# Patient Record
Sex: Male | Born: 2000 | Race: Black or African American | Hispanic: No | Marital: Single | Smoking: Never smoker
Health system: Southern US, Community
[De-identification: ages and names within clinical notes are randomized; demographics above are authoritative.]

## PROBLEM LIST (undated history)

## (undated) HISTORY — PX: NO PAST SURGERIES: SHX2092

---
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
## 2020-01-01 ENCOUNTER — Ambulatory Visit: Payer: Medicaid Other | Attending: Internal Medicine
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
## 2020-06-07 ENCOUNTER — Other Ambulatory Visit: Payer: Self-pay

## 2020-06-07 ENCOUNTER — Ambulatory Visit
Admission: EM | Admit: 2020-06-07 | Discharge: 2020-06-07 | Disposition: A | Discharge status: Home / Self Care | Payer: Medicaid Other | Attending: Emergency Medicine | Admitting: Emergency Medicine

## 2020-06-07 DIAGNOSIS — Z20822 Contact with and (suspected) exposure to covid-19: Secondary | ICD-10-CM | POA: Diagnosis not present

## 2020-06-07 DIAGNOSIS — R112 Nausea with vomiting, unspecified: Secondary | ICD-10-CM | POA: Diagnosis not present

## 2020-06-07 DIAGNOSIS — R059 Cough, unspecified: Secondary | ICD-10-CM

## 2020-06-07 DIAGNOSIS — R05 Cough: Secondary | ICD-10-CM | POA: Insufficient documentation

## 2020-06-07 LAB — SARS CORONAVIRUS 2 (TAT 6-24 HRS): SARS Coronavirus 2: NEGATIVE

## 2020-06-07 NOTE — Discharge Instructions (Signed)
You were seen at the Urgent Care for cough. Be sure to stay hydrated. Follow up with your PCP as needed.   If you haven't already, sign up for My Chart to have easy access to your labs results, and communication with your primary care physician.  Dr. Rachael Darby

## 2020-06-07 NOTE — ED Provider Notes (Signed)
[facility]    CSN: [id number] Arrival date & time: 06/07/20  1145      History   Chief Complaint Chief Complaint  Patient presents with  . Cough    HPI Marc Green is a 19 y.o. male.   HPI  Patient reports cough for the past 4-5 days. Endorses vomiting. Patient reports he got work at [employer] and vomited once after he got off work. Works 12 hours shift but doesn't typically eat or drink during his shift. States it gets very hot inside his job. Denies fever ,chills, abdominal pain, myalgias, headaches, shortness of breath or chest pain. Cough worse with smoking new methanol cigarettes. Normally smokes [employer]. Smokes 1/4 ppd. Started smoking 2-3 months ago. No other reported sick contacts.  No other complaints. [place] tested negative for COVID about a week ago at the pharmacy.  Patient is partially COVID vaccinated.    History reviewed. No pertinent past medical history.  There are no problems to display for this patient.   Past Surgical History:  Procedure Laterality Date  . NO PAST SURGERIES         Home Medications    Prior to Admission medications   Medication Sig Start Date End Date Taking? Authorizing Provider  benzonatate (TESSALON) 200 MG capsule Take 1 capsule (200 mg total) by mouth 3 (three) times daily as needed for cough. 08/02/19   Everlene Other G, DO  ipratropium (ATROVENT) 0.06 % nasal spray Place 2 sprays into both nostrils 4 (four) times daily as needed for rhinitis. 06/23/18 05/23/19  Tommie Sams, DO  loratadine (CLARITIN) 10 MG tablet Take 1 tablet (10 mg total) by mouth daily. 05/23/19 06/15/19  Verlee Monte, NP    Family History Family History  Problem Relation Age of Onset  . Hypertension Mother   . Asthma Mother     Social History Social History   Tobacco Use  . Smoking status: Never Smoker  . Smokeless tobacco: Never Used  Vaping Use  . Vaping Use: Former  Substance Use Topics  . Alcohol use: Never  . Drug use:  Never     Allergies   Cephalosporins   Review of Systems Review of Systems :See HPI    Physical Exam Triage Vital Signs ED Triage Vitals  Enc Vitals Group     BP 06/07/20 1223 119/75     Pulse Rate 06/07/20 1223 63     Resp 06/07/20 1223 16     Temp 06/07/20 1223 97.9 F (36.6 C)     Temp Source 06/07/20 1223 Oral     SpO2 06/07/20 1223 100 %     Weight 06/07/20 1225 120 lb (54.4 kg)     Height 06/07/20 1225 5\' 6"  (1.676 m)     Head Circumference --      Peak Flow --      Pain Score 06/07/20 1225 0     Pain Loc --      Pain Edu? --      Excl. in GC? --    No data found.  Updated Vital Signs BP 119/75 (BP Location: Left Arm)   Pulse 63   Temp 97.9 F (36.6 C) (Oral)   Resp 16   Ht 5\' 6"  (1.676 m)   Wt 120 lb (54.4 kg)   SpO2 100%   BMI 19.37 kg/m   Visual Acuity Right Eye Distance:   Left Eye Distance:   Bilateral Distance:    Right Eye Near:  Left Eye Near:    Bilateral Near:     Physical Exam Vitals and nursing note reviewed.  Constitutional:      Appearance: Normal appearance. He is not ill-appearing.  HENT:     Head: Normocephalic and atraumatic.     Nose: Nose normal.     Mouth/Throat:     Mouth: Mucous membranes are moist.     Pharynx: Oropharynx is clear.  Eyes:     General:        Right eye: No discharge.        Left eye: No discharge.     Extraocular Movements: Extraocular movements intact.     Conjunctiva/sclera: Conjunctivae normal.  Cardiovascular:     Rate and Rhythm: Normal rate and regular rhythm.     Pulses: Normal pulses.     Heart sounds: Normal heart sounds. No murmur heard.   Pulmonary:     Effort: Pulmonary effort is normal. No respiratory distress.     Breath sounds: Normal breath sounds.  Abdominal:     General: Abdomen is flat. Bowel sounds are normal.     Palpations: Abdomen is soft.     Tenderness: There is no abdominal tenderness. There is no guarding or rebound.  Musculoskeletal:        General: Normal  range of motion.     Cervical back: Normal range of motion and neck supple. No tenderness.  Skin:    General: Skin is warm and dry.     Capillary Refill: Capillary refill takes less than 2 seconds.  Neurological:     Mental Status: He is alert and oriented to person, place, and time. Mental status is at baseline.  Psychiatric:        Mood and Affect: Mood normal.        Behavior: Behavior normal.     UC Treatments / Results  Labs (all labs ordered are listed, but only abnormal results are displayed) Labs Reviewed  SARS CORONAVIRUS 2 (TAT 6-24 HRS)    EKG   Radiology No results found.  Procedures Procedures (including critical care time)  Medications Ordered in UC Medications - No data to display  Initial Impression / Assessment and Plan / UC Course  I have reviewed the triage vital signs and the nursing notes.  Pertinent labs & imaging results that were available during my care of the patient were reviewed by me and considered in my medical decision making (see chart for details).     Cough  Intermittent non productive cough for past 5 days. Appears to be associated with smoking new cigarettes but in the setting of vomiting can not rule out COVID. Covid test pending. Overall, patient is well appearing, afebrile and well hydrated.  Advised patient to stop smoking cigarettes. Work note provided.    Final Clinical Impressions(s) / UC Diagnoses   Final diagnoses:  Cough  Nausea and vomiting, intractability of vomiting not specified, unspecified vomiting type  Encounter for laboratory testing for COVID-19 virus     Discharge Instructions     You were seen at the Urgent Care for cough. Be sure to stay hydrated. Follow up with your PCP as needed.   If you haven't already, sign up for My Chart to have easy access to your labs results, and communication with your primary care physician.  Dr. Rachael Darby      ED Prescriptions    None     PDMP not reviewed this  encounter.   Katha Cabal, DO 06/07/20  1306  

## 2020-06-07 NOTE — ED Triage Notes (Signed)
Patient in today w/ c/o cough. Sx onset approx. 4-5 days ago.   Moderna 1st dose received 05/31/2020.

## 2021-01-20 ENCOUNTER — Other Ambulatory Visit: Payer: Self-pay

## 2021-01-20 ENCOUNTER — Ambulatory Visit
Admission: EM | Admit: 2021-01-20 | Discharge: 2021-01-20 | Disposition: A | Discharge status: Home / Self Care | Payer: Medicaid Other | Attending: Sports Medicine | Admitting: Sports Medicine

## 2021-01-20 DIAGNOSIS — B349 Viral infection, unspecified: Secondary | ICD-10-CM | POA: Insufficient documentation

## 2021-01-20 DIAGNOSIS — R1012 Left upper quadrant pain: Secondary | ICD-10-CM | POA: Diagnosis present

## 2021-01-20 DIAGNOSIS — J029 Acute pharyngitis, unspecified: Secondary | ICD-10-CM | POA: Diagnosis present

## 2021-01-20 DIAGNOSIS — R0989 Other specified symptoms and signs involving the circulatory and respiratory systems: Secondary | ICD-10-CM | POA: Insufficient documentation

## 2021-01-20 DIAGNOSIS — Z20822 Contact with and (suspected) exposure to covid-19: Secondary | ICD-10-CM | POA: Diagnosis not present

## 2021-01-20 DIAGNOSIS — R059 Cough, unspecified: Secondary | ICD-10-CM | POA: Diagnosis not present

## 2021-01-20 LAB — RESP PANEL BY RT-PCR (FLU A&B, COVID) ARPGX2
Influenza A by PCR: NEGATIVE
Influenza B by PCR: NEGATIVE
SARS Coronavirus 2 by RT PCR: NEGATIVE

## 2021-01-20 LAB — GROUP A STREP BY PCR: Group A Strep by PCR: NOT DETECTED

## 2021-01-20 NOTE — ED Notes (Signed)
Patient states that he does not want to have the mono blood test.  Dr. Zachery Dauer was notified.

## 2021-01-20 NOTE — Discharge Instructions (Addendum)
Please see educational handouts. Your strep, COVID, and influenza tests are all negative. I recommended doing a monotest but she declined. I did give you some educational handouts on mono, but we do not know if you have it,  this is just for your own education. Plenty of rest, plenty of fluids, Tylenol or Motrin for any fever or discomfort. Since we do not know your monotest status, you should not participate in any contact activities for 3 to 4 weeks after your symptoms resolve. If your abdominal pain was to worsen then I want you to go to the emergency room or see your primary care provider.

## 2021-01-20 NOTE — ED Triage Notes (Signed)
Patient states that he has been having a sore throat with severe pain with swallowing. Patient reports that he also has been having chest congestion. States that this started last night for him.

## 2021-01-20 NOTE — ED Provider Notes (Signed)
[facility]    CSN: [id number] Arrival date & time: 01/20/21  1146      History   Chief Complaint Chief Complaint  Patient presents with  . Sore Throat    HPI Marc Green is a 20 y.o. male.   Patient is a pleasant 20 year old male who presents for evaluation of a sore throat, difficulty swallowing, and some chest tightness for 2 days now.  He normally sees [facility] in [place].  They were unavailable to see him today.  He works for [employer] and has a part-time job at [employer].  He reports no cough, ear pain, chest pain, shortness of breath, headache, abdominal symptoms, or urinary symptoms.  He has been vaccinated x2 but no booster.  He has received his flu shot.  No COVID history of COVID exposure.  No mono history or mono exposure.  No red flag signs or symptoms were elicited on history.     History reviewed. No pertinent past medical history.  There are no problems to display for this patient.   Past Surgical History:  Procedure Laterality Date  . NO PAST SURGERIES         Home Medications    Prior to Admission medications   Medication Sig Start Date End Date Taking? Authorizing Provider  benzonatate (TESSALON) 200 MG capsule Take 1 capsule (200 mg total) by mouth 3 (three) times daily as needed for cough. 08/02/19   Everlene Other G, DO  ipratropium (ATROVENT) 0.06 % nasal spray Place 2 sprays into both nostrils 4 (four) times daily as needed for rhinitis. 06/23/18 05/23/19  Tommie Sams, DO  loratadine (CLARITIN) 10 MG tablet Take 1 tablet (10 mg total) by mouth daily. 05/23/19 06/15/19  Verlee Monte, NP    Family History Family History  Problem Relation Age of Onset  . Hypertension Mother   . Asthma Mother     Social History Social History   Tobacco Use  . Smoking status: Never Smoker  . Smokeless tobacco: Never Used  Vaping Use  . Vaping Use: Former  Substance Use Topics  . Alcohol use: Never  . Drug use: Never      Allergies   Cephalosporins   Review of Systems Review of Systems  Constitutional: Negative for activity change, appetite change, chills, diaphoresis, fatigue and fever.  HENT: Positive for sore throat and trouble swallowing. Negative for congestion, ear pain, postnasal drip, rhinorrhea, sinus pressure and sinus pain.   Eyes: Negative.  Negative for pain.  Respiratory: Positive for chest tightness. Negative for cough, shortness of breath, wheezing and stridor.   Cardiovascular: Negative.  Negative for chest pain and palpitations.  Gastrointestinal: Negative.  Negative for abdominal pain, diarrhea, nausea and vomiting.  Genitourinary: Negative.  Negative for dysuria.  Musculoskeletal: Negative.  Negative for arthralgias, back pain, myalgias, neck pain and neck stiffness.  Skin: Negative.  Negative for color change, pallor, rash and wound.  Neurological: Negative for dizziness, weakness, light-headedness, numbness and headaches.  All other systems reviewed and are negative.    Physical Exam Triage Vital Signs ED Triage Vitals [01/20/21 1159]  Enc Vitals Group     BP 124/78     Pulse Rate 85     Resp 18     Temp 99.4 F (37.4 C)     Temp Source Oral     SpO2 100 %     Weight 122 lb (55.3 kg)     Height  Head Circumference      Peak Flow      Pain Score 7     Pain Loc      Pain Edu?      Excl. in GC?    No data found.  Updated Vital Signs BP 124/78 (BP Location: Left Arm)   Pulse 85   Temp 99.4 F (37.4 C) (Oral)   Resp 18   Wt 55.3 kg   SpO2 100%   BMI 19.69 kg/m   Visual Acuity Right Eye Distance:   Left Eye Distance:   Bilateral Distance:    Right Eye Near:   Left Eye Near:    Bilateral Near:     Physical Exam Vitals and nursing note reviewed.  Constitutional:      General: He is not in acute distress.    Appearance: He is well-developed. He is not ill-appearing, toxic-appearing or diaphoretic.  HENT:     Head: Normocephalic and  atraumatic.     Right Ear: Tympanic membrane normal.     Left Ear: Tympanic membrane normal.     Nose: Nose normal. No congestion or rhinorrhea.     Mouth/Throat:     Mouth: Mucous membranes are moist. No oral lesions.     Pharynx: Uvula midline. Posterior oropharyngeal erythema present. No pharyngeal swelling, oropharyngeal exudate or uvula swelling.     Tonsils: No tonsillar exudate or tonsillar abscesses. 2+ on the right. 2+ on the left.  Eyes:     General: No scleral icterus.       Right eye: No discharge.        Left eye: No discharge.     Extraocular Movements: Extraocular movements intact.     Right eye: Normal extraocular motion.     Left eye: Normal extraocular motion.     Conjunctiva/sclera: Conjunctivae normal.     Pupils: Pupils are equal, round, and reactive to light.  Neck:     Thyroid: No thyromegaly.  Cardiovascular:     Rate and Rhythm: Normal rate and regular rhythm.     Pulses: Normal pulses.     Heart sounds: Normal heart sounds. No murmur heard. No friction rub. No gallop.   Pulmonary:     Effort: Pulmonary effort is normal. No respiratory distress.     Breath sounds: Normal breath sounds. No stridor. No wheezing, rhonchi or rales.  Abdominal:     General: Bowel sounds are normal.     Palpations: Abdomen is soft. There is no shifting dullness, fluid wave, hepatomegaly or splenomegaly.     Tenderness: There is abdominal tenderness in the left upper quadrant. There is no right CVA tenderness, left CVA tenderness, guarding or rebound. Negative signs include Murphy's sign, Rovsing's sign, McBurney's sign and psoas sign.  Musculoskeletal:     Cervical back: Normal range of motion and neck supple.  Lymphadenopathy:     Cervical: Cervical adenopathy present.  Skin:    General: Skin is warm and dry.     Capillary Refill: Capillary refill takes less than 2 seconds.     Coloration: Skin is not pale.     Findings: No erythema or rash.  Neurological:     General: No  focal deficit present.     Mental Status: He is alert and oriented to person, place, and time.      UC Treatments / Results  Labs (all labs ordered are listed, but only abnormal results are displayed) Labs Reviewed  RESP PANEL BY RT-PCR (FLU A&B, COVID)  ARPGX2  GROUP A STREP BY PCR    EKG   Radiology No results found.  Procedures Procedures (including critical care time)  Medications Ordered in UC Medications - No data to display  Initial Impression / Assessment and Plan / UC Course  I have reviewed the triage vital signs and the nursing notes.  Pertinent labs & imaging results that were available during my care of the patient were reviewed by me and considered in my medical decision making (see chart for details).  Clinical impression: 20 year old male with 2 days of sore throat, difficulty swallowing, and chest tightness.  No evidence of anaphylaxis.  He does not have any other symptoms.  Treatment plan: 1.  The findings and treatment plan were discussed in detail with the patient.  Patient was in agreement. 2.  We will get a strep test.  It was negative. 3.  Also obtain a respiratory panel including a COVID-19 test, influenza A and influenza B.  They were all negative. 4.  Given his severe sore throat and a negative strep and negative respiratory panel, I recommended getting a mononucleosis test.  The staff went in to draw the blood but he declined with going through with this test. 5.  I did give him some educational handouts on mononucleosis.  He should avoid any strenuous or contact activity for at least 3 weeks after his symptoms resolved.  He voiced verbal understanding. 6.  Plenty of rest, plenty of fluids, Tylenol or Motrin for any fever or discomfort. 7.  I did give him a work note keeping him out of his part-time job over the weekend.  He can return to work on Monday. 8.  If his symptoms were to worsen he knows to see his primary care provider, or come back here,  or if they worsen in any way he should go to the ER.  He does have left upper quadrant pain over the spleen which makes mono a concerning diagnosis.  If his belly pain got worse I advised him to go to the ER. 9.  He was discharged in stable condition and he will follow-up with us as needed.  Greater than 30 minutes was spent with the patient, obtaining a history, reviewing his chart in detail, doing a thorough review of systems and physical exam, doing testing and interpreting the results, counseling the patient on the fact that he declined on mono testing, again the patient on the risks of mono as well as his abdominal pain and when to seek out medical attention, providing educational handouts, and encouraging and answering all questions.    Final Clinical Impressions(s) / UC Diagnoses   Final diagnoses:  Pharyngitis, unspecified etiology  Abdominal pain, left upper quadrant  Chest congestion  Cough  Viral illness     Discharge Instructions     Please see educational handouts. Your strep, COVID, and influenza tests are all negative. I recommended doing a monotest but she declined. I did give you some educational handouts on mono, but we do not know if you have it,  this is just for your own education. Plenty of rest, plenty of fluids, Tylenol or Motrin for any fever or discomfort. Since we do not know your monotest status, you should not participate in any contact activities for 3 to 4 weeks after your symptoms resolve. If your abdominal pain was to worsen then I want you to go to the emergency room or see your primary care provider.    ED Prescriptions  None     PDMP not reviewed this encounter.   Delton See, MD 01/22/21 [phone]

## 2021-01-22 ENCOUNTER — Ambulatory Visit
Admission: EM | Admit: 2021-01-22 | Discharge: 2021-01-22 | Disposition: A | Discharge status: Home / Self Care | Payer: Medicaid Other | Attending: Family Medicine | Admitting: Family Medicine

## 2021-01-22 ENCOUNTER — Other Ambulatory Visit: Payer: Self-pay

## 2021-01-22 DIAGNOSIS — M546 Pain in thoracic spine: Secondary | ICD-10-CM | POA: Diagnosis not present

## 2021-01-22 MED ORDER — MELOXICAM 15 MG PO TABS: 15.0000 mg | ORAL_TABLET | Freq: Every day | ORAL | 0 refills | Status: DC | PRN

## 2021-01-22 NOTE — ED Provider Notes (Signed)
[facility]    CSN: [id number] Arrival date & time: 01/22/21  1440      History   Chief Complaint Chief Complaint  Patient presents with  . Back Pain   HPI  20 year old male presents with thoracic back pain.  Patient states that his pain started a few hours ago.  He states it is located in the mid back.  No fall, trauma, injury.  No medications or interventions tried.  When asked why he has not attempted anything he states that he did not know what to take.  Pain 5/10 in severity.  No other complaints.  Home Medications    Prior to Admission medications   Medication Sig Start Date End Date Taking? Authorizing Provider  meloxicam (MOBIC) 15 MG tablet Take 1 tablet (15 mg total) by mouth daily as needed for pain. 01/22/21  Yes Clebert Wenger G, DO  ipratropium (ATROVENT) 0.06 % nasal spray Place 2 sprays into both nostrils 4 (four) times daily as needed for rhinitis. 06/23/18 05/23/19  Tommie Sams, DO  loratadine (CLARITIN) 10 MG tablet Take 1 tablet (10 mg total) by mouth daily. 05/23/19 06/15/19  Verlee Monte, NP    Family History Family History  Problem Relation Age of Onset  . Hypertension Mother   . Asthma Mother     Social History Social History   Tobacco Use  . Smoking status: Never Smoker  . Smokeless tobacco: Never Used  Vaping Use  . Vaping Use: Former  Substance Use Topics  . Alcohol use: Never  . Drug use: Never     Allergies   Cephalosporins   Review of Systems Review of Systems  Constitutional: Negative.   Musculoskeletal: Positive for back pain.   Physical Exam Triage Vital Signs ED Triage Vitals  Enc Vitals Group     BP 01/22/21 1519 106/60     Pulse Rate 01/22/21 1519 73     Resp 01/22/21 1519 14     Temp 01/22/21 1519 98.6 F (37 C)     Temp Source 01/22/21 1519 Oral     SpO2 01/22/21 1519 100 %     Weight 01/22/21 1518 121 lb 14.6 oz (55.3 kg)     Height 01/22/21 1518 5\' 5"  (1.651 m)     Head Circumference --      Peak  Flow --      Pain Score 01/22/21 1518 5     Pain Loc --      Pain Edu? --      Excl. in GC? --    Updated Vital Signs BP 106/60 (BP Location: Left Arm)   Pulse 73   Temp 98.6 F (37 C) (Oral)   Resp 14   Ht 5\' 5"  (1.651 m)   Wt 55.3 kg   SpO2 100%   BMI 20.29 kg/m   Visual Acuity Right Eye Distance:   Left Eye Distance:   Bilateral Distance:    Right Eye Near:   Left Eye Near:    Bilateral Near:     Physical Exam Vitals and nursing note reviewed.  Constitutional:      General: He is not in acute distress.    Appearance: Normal appearance. He is not ill-appearing.  HENT:     Head: Normocephalic and atraumatic.  Eyes:     General:        Right eye: No discharge.        Left eye: No discharge.     Conjunctiva/sclera: Conjunctivae  normal.  Cardiovascular:     Rate and Rhythm: Normal rate and regular rhythm.  Pulmonary:     Effort: Pulmonary effort is normal.     Breath sounds: Normal breath sounds.  Musculoskeletal:     Comments: No tenderness of the thoracic spine on exam  Neurological:     Mental Status: He is alert.  Psychiatric:        Mood and Affect: Mood normal.        Behavior: Behavior normal.    UC Treatments / Results  Labs (all labs ordered are listed, but only abnormal results are displayed) Labs Reviewed - No data to display  EKG   Radiology No results found.  Procedures Procedures (including critical care time)  Medications Ordered in UC Medications - No data to display  Initial Impression / Assessment and Plan / UC Course  I have reviewed the triage vital signs and the nursing notes.  Pertinent labs & imaging results that were available during my care of the patient were reviewed by me and considered in my medical decision making (see chart for details).    20 year old male presents with acute thoracic back pain.  Meloxicam as directed.  Supportive care.  Final Clinical Impressions(s) / UC Diagnoses   Final diagnoses:  Acute  thoracic back pain, unspecified back pain laterality     Discharge Instructions     Apply ice and heat.  Medication as directed.  Take care  Dr. Adriana Simas    ED Prescriptions    Medication Sig Dispense Auth. Provider   meloxicam (MOBIC) 15 MG tablet Take 1 tablet (15 mg total) by mouth daily as needed for pain. 30 tablet Tommie Sams, DO     PDMP not reviewed this encounter.   Tommie Sams, [place] 01/22/21 1531

## 2021-01-22 NOTE — ED Triage Notes (Signed)
Patient states that he is having back pain that started a few hours ago without injury. Mid back pain.

## 2021-01-22 NOTE — Discharge Instructions (Signed)
Apply ice and heat.  Medication as directed.  Take care  Dr. Adriana Simas

## 2021-05-24 ENCOUNTER — Encounter: Payer: Self-pay | Admitting: Emergency Medicine

## 2021-05-24 ENCOUNTER — Ambulatory Visit
Admission: EM | Admit: 2021-05-24 | Discharge: 2021-05-24 | Disposition: A | Discharge status: Home / Self Care | Payer: Medicaid Other | Attending: Family Medicine | Admitting: Family Medicine

## 2021-05-24 ENCOUNTER — Other Ambulatory Visit: Payer: Self-pay

## 2021-05-24 DIAGNOSIS — R0981 Nasal congestion: Secondary | ICD-10-CM

## 2021-05-24 DIAGNOSIS — R059 Cough, unspecified: Secondary | ICD-10-CM | POA: Diagnosis not present

## 2021-05-24 MED ORDER — BENZONATATE 200 MG PO CAPS: 200.0000 mg | ORAL_CAPSULE | Freq: Three times a day (TID) | ORAL | 0 refills | Status: DC | PRN

## 2021-05-24 MED ORDER — FEXOFENADINE HCL 60 MG PO TABS: 60.0000 mg | ORAL_TABLET | Freq: Two times a day (BID) | ORAL | 1 refills | Status: DC

## 2021-05-24 NOTE — ED Triage Notes (Signed)
Pt presents today with c/o of dry barky cough x 4 days. Denies fever.  He declines to have Covid test performed today. He is requesting two work notes for work (two jobs).

## 2021-05-24 NOTE — Discharge Instructions (Addendum)
Medication as prescribed.  Take care  Dr. Janelys Glassner  

## 2021-05-24 NOTE — ED Provider Notes (Signed)
[facility]    CSN: [id number] Arrival date & time: 05/24/21  1818      History   Chief Complaint Chief Complaint  Patient presents with   Cough    HPI  20 year old male presents with cough.   4 days history of cough. Also has some nasal congestion which he attributes to allergies. Declines COVID testing. No report sick contacts. No fever. No SOB. No relieving factors. No other complaints.   Home Medications    Prior to Admission medications   Medication Sig Start Date End Date Taking? Authorizing Provider  benzonatate (TESSALON) 200 MG capsule Take 1 capsule (200 mg total) by mouth 3 (three) times daily as needed for cough. 05/24/21  Yes Ludene Stokke G, DO  fexofenadine (ALLEGRA) 60 MG tablet Take 1 tablet (60 mg total) by mouth 2 (two) times daily. 05/24/21  Yes Winnifred Dufford G, DO  ipratropium (ATROVENT) 0.06 % nasal spray Place 2 sprays into both nostrils 4 (four) times daily as needed for rhinitis. 06/23/18 05/23/19  Tommie Sams, DO  loratadine (CLARITIN) 10 MG tablet Take 1 tablet (10 mg total) by mouth daily. 05/23/19 06/15/19  Verlee Monte, NP    Family History Family History  Problem Relation Age of Onset   Hypertension Mother    Asthma Mother     Social History Social History   Tobacco Use   Smoking status: Never   Smokeless tobacco: Never  Vaping Use   Vaping Use: Never used  Substance Use Topics   Alcohol use: Never   Drug use: Yes    Types: Marijuana    Comment: 2xday     Allergies   Cephalosporins   Review of Systems Review of Systems Per HPI  Physical Exam Triage Vital Signs ED Triage Vitals  Enc Vitals Group     BP 05/24/21 1909 135/79     Pulse Rate 05/24/21 1909 87     Resp 05/24/21 1909 18     Temp 05/24/21 1909 98.2 F (36.8 C)     Temp Source 05/24/21 1909 Oral     SpO2 05/24/21 1909 100 %     Weight --      Height --      Head Circumference --      Peak Flow --      Pain Score 05/24/21 1910 0     Pain Loc --       Pain Edu? --      Excl. in GC? --    Updated Vital Signs BP 135/79 (BP Location: Left Arm)   Pulse 87   Temp 98.2 F (36.8 C) (Oral)   Resp 18   SpO2 100%   Visual Acuity Right Eye Distance:   Left Eye Distance:   Bilateral Distance:    Right Eye Near:   Left Eye Near:    Bilateral Near:     Physical Exam Constitutional:      General: He is not in acute distress.    Appearance: Normal appearance. He is not ill-appearing.  HENT:     Head: Normocephalic and atraumatic.  Cardiovascular:     Rate and Rhythm: Normal rate and regular rhythm.  Pulmonary:     Effort: Pulmonary effort is normal.     Breath sounds: Normal breath sounds. No wheezing or rales.  Neurological:     Mental Status: He is alert.   UC Treatments / Results  Labs (all labs ordered are listed, but only abnormal  results are displayed) Labs Reviewed - No data to display  EKG   Radiology No results found.  Procedures Procedures (including critical care time)  Medications Ordered in UC Medications - No data to display  Initial Impression / Assessment and Plan / UC Course  I have reviewed the triage vital signs and the nursing notes.  Pertinent labs & imaging results that were available during my care of the patient were reviewed by me and considered in my medical decision making (see chart for details).    20 year old male presents with cough and congestion. Exam unremarkable. Likely viral with a component of underlying allergies. Allegra and Tessalon perles as prescribed.   Final Clinical Impressions(s) / UC Diagnoses   Final diagnoses:  Cough  Nasal congestion     Discharge Instructions      Medication as prescribed.  Take care  Dr. Adriana Simas    ED Prescriptions     Medication Sig Dispense Auth. Provider   fexofenadine (ALLEGRA) 60 MG tablet Take 1 tablet (60 mg total) by mouth 2 (two) times daily. 60 tablet Jerzi Tigert G, DO   benzonatate (TESSALON) 200 MG capsule Take 1 capsule  (200 mg total) by mouth 3 (three) times daily as needed for cough. 30 capsule Tommie Sams, DO      PDMP not reviewed this encounter.   Tommie Sams, [place] 05/25/21 2237

## 2021-06-18 ENCOUNTER — Ambulatory Visit
Admission: EM | Admit: 2021-06-18 | Discharge: 2021-06-18 | Disposition: A | Discharge status: Home / Self Care | Payer: Medicaid Other | Attending: Internal Medicine | Admitting: Internal Medicine

## 2021-06-18 ENCOUNTER — Other Ambulatory Visit: Payer: Self-pay

## 2021-06-18 DIAGNOSIS — M25512 Pain in left shoulder: Secondary | ICD-10-CM | POA: Diagnosis not present

## 2021-06-18 MED ORDER — IBUPROFEN 600 MG PO TABS: 600.0000 mg | ORAL_TABLET | Freq: Three times a day (TID) | ORAL | 0 refills | Status: DC | PRN

## 2021-06-18 NOTE — ED Triage Notes (Signed)
Pt here with C/O left arm pain. No known injury. Has some stomach pains that are off and on. Pt needs note to return to work on Wednesday.

## 2021-06-18 NOTE — Discharge Instructions (Addendum)
Please stretch before and after exercise Icing of the shoulder after exercising Take medications as prescribed If symptoms worsen please return to urgent care.

## 2021-06-20 NOTE — ED Provider Notes (Signed)
[facility]    CSN: [id number] Arrival date & time: 06/18/21  1131      History   Chief Complaint Chief Complaint  Patient presents with   Arm Pain    HPI Marc Green is a 20 y.o. male comes to the urgent care with left arm pain of several days duration.  Patient does a lot of weight lifting.  He weighs about 130 pounds but benches about 200 pounds.  He also does 300 push-ups every day.  No trauma to the left arm or shoulder.  No swelling.  Patient has full range of motion of the left arm.  Pain is worse with repeated exercising.  No known relieving factors.  Patient has not tried any over-the-counter medication.  He is basically requesting a return to work note  HPI  History reviewed. No pertinent past medical history.  There are no problems to display for this patient.   Past Surgical History:  Procedure Laterality Date   NO PAST SURGERIES         Home Medications    Prior to Admission medications   Medication Sig Start Date End Date Taking? Authorizing Provider  ibuprofen (ADVIL) 600 MG tablet Take 1 tablet (600 mg total) by mouth every 8 (eight) hours as needed. 06/18/21  Yes Anaih Brander, Britta Mccreedy, MD  ipratropium (ATROVENT) 0.06 % nasal spray Place 2 sprays into both nostrils 4 (four) times daily as needed for rhinitis. 06/23/18 05/23/19  Tommie Sams, DO  loratadine (CLARITIN) 10 MG tablet Take 1 tablet (10 mg total) by mouth daily. 05/23/19 06/15/19  Verlee Monte, NP    Family History Family History  Problem Relation Age of Onset   Hypertension Mother    Asthma Mother     Social History Social History   Tobacco Use   Smoking status: Never   Smokeless tobacco: Never  Vaping Use   Vaping Use: Never used  Substance Use Topics   Alcohol use: Never   Drug use: Yes    Types: Marijuana    Comment: 2xday     Allergies   Cephalosporins   Review of Systems Review of Systems  Cardiovascular: Negative.   Gastrointestinal: Negative.    Musculoskeletal:  Positive for arthralgias and myalgias. Negative for joint swelling, neck pain and neck stiffness.  Skin: Negative.     Physical Exam Triage Vital Signs ED Triage Vitals  Enc Vitals Group     BP 06/18/21 1211 120/78     Pulse Rate 06/18/21 1211 67     Resp 06/18/21 1211 18     Temp 06/18/21 1211 99 F (37.2 C)     Temp Source 06/18/21 1211 Oral     SpO2 06/18/21 1211 98 %     Weight 06/18/21 1209 130 lb (59 kg)     Height 06/18/21 1209 5\' 7"  (1.702 m)     Head Circumference --      Peak Flow --      Pain Score 06/18/21 1209 5     Pain Loc --      Pain Edu? --      Excl. in GC? --    No data found.  Updated Vital Signs BP 120/78 (BP Location: Right Arm)   Pulse 67   Temp 99 F (37.2 C) (Oral)   Resp 18   Ht 5\' 7"  (1.702 m)   Wt 59 kg   SpO2 98%   BMI 20.36 kg/m   Visual Acuity Right Eye  Distance:   Left Eye Distance:   Bilateral Distance:    Right Eye Near:   Left Eye Near:    Bilateral Near:     Physical Exam Vitals and nursing note reviewed.  Constitutional:      General: He is not in acute distress.    Appearance: He is not ill-appearing.  Cardiovascular:     Rate and Rhythm: Normal rate and regular rhythm.  Pulmonary:     Effort: Pulmonary effort is normal.     Breath sounds: Normal breath sounds.  Musculoskeletal:        General: No swelling, tenderness, deformity or signs of injury. Normal range of motion.  Skin:    General: Skin is warm.  Neurological:     Mental Status: He is alert.     UC Treatments / Results  Labs (all labs ordered are listed, but only abnormal results are displayed) Labs Reviewed - No data to display  EKG   Radiology No results found.  Procedures Procedures (including critical care time)  Medications Ordered in UC Medications - No data to display  Initial Impression / Assessment and Plan / UC Course  I have reviewed the triage vital signs and the nursing notes.  Pertinent labs & imaging  results that were available during my care of the patient were reviewed by me and considered in my medical decision making (see chart for details).     1.  Left shoulder sprain: Ibuprofen 600 mg every 6 hours as needed for pain Patient is advised to scale back the exercises Gentle range of motion exercises before and after exercise Icing of the left shoulder after workouts Return precautions given. Final Clinical Impressions(s) / UC Diagnoses   Final diagnoses:  Acute pain of left shoulder     Discharge Instructions      Please stretch before and after exercise Icing of the shoulder after exercising Take medications as prescribed If symptoms worsen please return to urgent care.   ED Prescriptions     Medication Sig Dispense Auth. Provider   ibuprofen (ADVIL) 600 MG tablet Take 1 tablet (600 mg total) by mouth every 8 (eight) hours as needed. 30 tablet Anastasio Wogan, Britta Mccreedy, MD      PDMP not reviewed this encounter.   Merrilee Jansky, MD 06/20/21 1049

## 2021-09-28 ENCOUNTER — Ambulatory Visit
Admission: EM | Admit: 2021-09-28 | Discharge: 2021-09-28 | Disposition: A | Discharge status: Home / Self Care | Payer: Medicaid Other | Attending: Physician Assistant | Admitting: Physician Assistant

## 2021-09-28 ENCOUNTER — Other Ambulatory Visit: Payer: Self-pay

## 2021-09-28 DIAGNOSIS — M546 Pain in thoracic spine: Secondary | ICD-10-CM | POA: Diagnosis not present

## 2021-09-28 DIAGNOSIS — R1110 Vomiting, unspecified: Secondary | ICD-10-CM | POA: Diagnosis not present

## 2021-09-28 MED ORDER — DICLOFENAC SODIUM 75 MG PO TBEC: 75.0000 mg | DELAYED_RELEASE_TABLET | Freq: Two times a day (BID) | ORAL | 0 refills | Status: AC | PRN

## 2021-09-28 NOTE — ED Triage Notes (Signed)
Pt c/o back pain x3days. 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   Pt vomited this morning but feels it is due to eating late at night.

## 2021-09-28 NOTE — ED Provider Notes (Signed)
[facility]    CSN: [id number] Arrival date & time: 09/28/21  1106      History   Chief Complaint Chief Complaint  Patient presents with   Back Pain    HPI Marc Green is a 21 y.o. male presenting for concerns regarding intermittent left upper back pain x3 days.  Patient says that he works 2 jobs.  Works at [employer].  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  Reports that he has to stand for long periods of time at [facility].  Patient has tried Tylenol and says it does not help the pain.  He is not currently having the back pain.  Denies any injuries recently.  Does not report any chest pain or breathing trouble.  Patient also reports an episode of vomiting this morning after he woke up.  States that he ate cookout fast food at 11 PM and then went straight to bed.  Reports he got up for work today and vomited in his car on the way to work so he called into work.  Patient reports he will need a work note to return and would like to go back tomorrow.  He said he is not having any nausea now and denies any associated fever, fatigue, aches, cough, congestion or sore throat.  No abdominal pain and does not report any urinary symptoms.  He believes it is because he ate fast food and then went to bed.  HPI  History reviewed. No pertinent past medical history.  There are no problems to display for this patient.   Past Surgical History:  Procedure Laterality Date   NO PAST SURGERIES         Home Medications    Prior to Admission medications   Medication Sig Start Date End Date Taking? Authorizing Provider  diclofenac (VOLTAREN) 75 MG EC tablet Take 1 tablet (75 mg total) by mouth 2 (two) times daily as needed for up to 15 days. 09/28/21 10/13/21 Yes Laurene Footman B, PA-C  ipratropium (ATROVENT) 0.06 % nasal spray Place 2 sprays into both nostrils 4 (four) times daily as needed for rhinitis. 06/23/18 05/23/19  Coral Spikes, DO  loratadine (CLARITIN) 10 MG tablet Take 1  tablet (10 mg total) by mouth daily. 05/23/19 06/15/19  Karen Kitchens, NP    Family History Family History  Problem Relation Age of Onset   Hypertension Mother    Asthma Mother     Social History Social History   Tobacco Use   Smoking status: Some Days    Types: Cigarettes   Smokeless tobacco: Never  Vaping Use   Vaping Use: Never used  Substance Use Topics   Alcohol use: Never   Drug use: Yes    Types: Marijuana    Comment: 2xday     Allergies   Cephalosporins   Review of Systems Review of Systems  Constitutional:  Negative for appetite change, fatigue and fever.  HENT:  Negative for congestion, rhinorrhea and sore throat.   Respiratory:  Negative for cough and shortness of breath.   Cardiovascular:  Negative for chest pain.  Gastrointestinal:  Positive for vomiting (x1). Negative for abdominal pain, diarrhea and nausea.  Genitourinary:  Negative for dysuria.  Musculoskeletal:  Positive for back pain (Not currently).  Neurological:  Negative for weakness and headaches.    Physical Exam Triage Vital Signs ED Triage Vitals  Enc Vitals Group     BP 09/28/21 1120 121/86     Pulse Rate  09/28/21 1120 77     Resp 09/28/21 1120 18     Temp 09/28/21 1120 98.2 F (36.8 C)     Temp Source 09/28/21 1120 Oral     SpO2 09/28/21 1120 97 %     Weight 09/28/21 1118 135 lb (61.2 kg)     Height 09/28/21 1118 5\' 6"  (1.676 m)     Head Circumference --      Peak Flow --      Pain Score 09/28/21 1117 6     Pain Loc --      Pain Edu? --      Excl. in [place]? --    No data found.  Updated Vital Signs BP 121/86 (BP Location: Left Arm)    Pulse 77    Temp 98.2 F (36.8 C) (Oral)    Resp 18    Ht 5\' 6"  (1.676 m)    Wt 135 lb (61.2 kg)    SpO2 97%    BMI 21.79 kg/m      Physical Exam Vitals and nursing note reviewed.  Constitutional:      General: He is not in acute distress.    Appearance: Normal appearance. He is well-developed. He is not ill-appearing.  HENT:     Head:  Normocephalic and atraumatic.  Eyes:     General: No scleral icterus.    Conjunctiva/sclera: Conjunctivae normal.  Cardiovascular:     Rate and Rhythm: Normal rate and regular rhythm.     Heart sounds: Normal heart sounds.  Pulmonary:     Effort: Pulmonary effort is normal. No respiratory distress.     Breath sounds: Normal breath sounds.  Abdominal:     Palpations: Abdomen is soft.     Tenderness: There is no abdominal tenderness.  Musculoskeletal:     Cervical back: Neck supple.     Thoracic back: Normal.     Lumbar back: Normal.  Skin:    General: Skin is warm and dry.     Capillary Refill: Capillary refill takes less than 2 seconds.  Neurological:     General: No focal deficit present.     Mental Status: He is alert. Mental status is at baseline.     Motor: No weakness.     Coordination: Coordination normal.     Gait: Gait normal.  Psychiatric:        Mood and Affect: Mood normal.        Behavior: Behavior normal.        Thought Content: Thought content normal.     UC Treatments / Results  Labs (all labs ordered are listed, but only abnormal results are displayed) Labs Reviewed - No data to display  EKG   Radiology No results found.  Procedures Procedures (including critical care time)  Medications Ordered in UC Medications - No data to display  Initial Impression / Assessment and Plan / UC Course  I have reviewed the triage vital signs and the nursing notes.  Pertinent labs & imaging results that were available during my care of the patient were reviewed by me and considered in my medical decision making (see chart for details).  21 year old male presenting for 2 separate complaints.  Reports left-sided back pain intermittently x3 days.  No specific injury.  Patient also reports vomiting x1 today.  Reports he needs a work note because he did not go to work today.  Suspect muscle strain in regards to his back pain.  He is not currently having any  back pain  and exam of back is normal.  Prescribe diclofenac for him to take as needed and also reviewed muscle rubs and heating pad.  Reviewed return and ER precautions.  I do agree with patient that the vomiting was probably related to eating a large fast food meal and then going to sleep.  He says he feels fine now.  Advised if he were to develop fever, abdominal pain, vomiting or any other symptoms he should be seen again.  He agrees.  Final Clinical Impressions(s) / UC Diagnoses   Final diagnoses:  Acute left-sided thoracic back pain  Vomiting, unspecified vomiting type, unspecified whether nausea present     Discharge Instructions      BACK PAIN: Stressed avoiding painful activities . RICE (REST, ICE, COMPRESSION, ELEVATION) guidelines reviewed. May alternate ice and heat. Consider use of muscle rubs, Salonpas patches, etc. Use medications as directed including muscle relaxers if prescribed. Take anti-inflammatory medications as prescribed or OTC NSAIDs/Tylenol.  F/u with PCP in 7-10 days for reexamination, and please feel free to call or return to the urgent care at any time for any questions or concerns you may have and we will be happy to help you!   BACK PAIN RED FLAGS: If the back pain acutely worsens or there are any red flag symptoms such as numbness/tingling, leg weakness, saddle anesthesia, or loss of bowel/bladder control, go immediately to the ER. Follow up with [place] as scheduled or sooner if the pain does not begin to resolve or if it worsens before the follow up       ED Prescriptions     Medication Sig Dispense Auth. Provider   diclofenac (VOLTAREN) 75 MG EC tablet Take 1 tablet (75 mg total) by mouth 2 (two) times daily as needed for up to 15 days. 30 tablet Gretta Cool      PDMP not reviewed this encounter.   Danton Clap, PA-C 09/28/21 1156

## 2021-09-28 NOTE — Discharge Instructions (Signed)

## 2022-06-16 ENCOUNTER — Ambulatory Visit
Admission: EM | Admit: 2022-06-16 | Discharge: 2022-06-16 | Disposition: A | Discharge status: Home / Self Care | Payer: Medicaid Other | Attending: Family Medicine | Admitting: Family Medicine

## 2022-06-16 ENCOUNTER — Encounter: Payer: Self-pay | Admitting: Emergency Medicine

## 2022-06-16 DIAGNOSIS — S0502XA Injury of conjunctiva and corneal abrasion without foreign body, left eye, initial encounter: Secondary | ICD-10-CM

## 2022-06-16 MED ORDER — BACITRACIN-POLYMYXIN B 500-10000 UNIT/GM OP OINT: TOPICAL_OINTMENT | Freq: Four times a day (QID) | OPHTHALMIC | Status: AC

## 2022-06-16 MED ORDER — POLYMYXIN B-TRIMETHOPRIM 10000-0.1 UNIT/ML-% OP SOLN: 2.0000 [drp] | OPHTHALMIC | 0 refills | Status: DC

## 2022-06-16 NOTE — Discharge Instructions (Signed)
Stop by the pharmacy to pick up your prescriptions.  Follow up with your primary care provider as needed.  

## 2022-06-16 NOTE — ED Provider Notes (Signed)
[facility]    CSN: [id number] Arrival date & time: 06/16/22  1119      History   Chief Complaint Chief Complaint  Patient presents with   Eye Pain    left    HPI HPI  Marc Green is a 21 y.o. male.    Marc Green presents for left eye pain that started 4 days. Marc Green feels like something is in his eye but does not see anything. No treatments tried prior to arrival.   Marc Green does not wear glasses or contacts.  Marc Green has not had any trouble seeing or discharge from the eye.  At times, the light hurts his eye. Left eye pain remains.  Marc Green has otherwise been well and has no additional concerns today.   Fever : no  Cough: no Nasal congestion : no  Appetite: normal  Hydration: normal  Vomiting: no Dysuria: no  Sleep disturbance: no Back Pain: no Headache: yes  History reviewed. No pertinent past medical history.  There are no problems to display for this patient.   Past Surgical History:  Procedure Laterality Date   NO PAST SURGERIES         Home Medications    Prior to Admission medications   Medication Sig Start Date End Date Taking? Authorizing Provider  trimethoprim-polymyxin b (POLYTRIM) ophthalmic solution Place 2 drops into the left eye every 4 (four) hours. 06/16/22  Yes Ryllie Nieland, DO  ipratropium (ATROVENT) 0.06 % nasal spray Place 2 sprays into both nostrils 4 (four) times daily as needed for rhinitis. 06/23/18 05/23/19  Tommie Sams, DO  loratadine (CLARITIN) 10 MG tablet Take 1 tablet (10 mg total) by mouth daily. 05/23/19 06/15/19  Verlee Monte, NP    Family History Family History  Problem Relation Age of Onset   Hypertension Mother    Asthma Mother     Social History Social History   Tobacco Use   Smoking status: Some Days    Types: Cigarettes   Smokeless tobacco: Never  Vaping Use   Vaping Use: Never used  Substance Use Topics   Alcohol use: Never   Drug use: Yes    Types: Marijuana    Comment:  2xday     Allergies   Cephalosporins   Review of Systems Review of Systems : negative unless otherwise stated in HPI.      Physical Exam Triage Vital Signs ED Triage Vitals  Enc Vitals Group     BP 06/16/22 1209 (!) 145/85     Pulse Rate 06/16/22 1209 87     Resp 06/16/22 1209 15     Temp 06/16/22 1209 98.6 F (37 C)     Temp Source 06/16/22 1209 Oral     SpO2 06/16/22 1209 99 %     Weight 06/16/22 1207 140 lb (63.5 kg)     Height 06/16/22 1207 5\' 6"  (1.676 m)     Head Circumference --      Peak Flow --      Pain Score 06/16/22 1207 8     Pain Loc --      Pain Edu? --      Excl. in [place]? --    No data found.  Updated Vital Signs BP (!) 145/85 (BP Location: Left Arm)   Pulse 87   Temp 98.6 F (37 C) (Oral)   Resp 15   Ht 5\' 6"  (1.676 m)   Wt 63.5 kg   SpO2 99%   BMI 22.60 kg/m  Visual Acuity Right Eye Distance: 20/25 uncorrected Left Eye Distance: 20/40 uncorrected Bilateral Distance: 20/20 uncorrected  Right Eye Near:   Left Eye Near:    Bilateral Near:     Physical Exam  GEN: pleasant well appearing male, in no acute distress  NECK: normal ROM  CV: regular rate RESP: no increased work of breathing EYES:     General: Lids are normal. Lids are everted, no foreign bodies appreciated. Vision grossly intact. Gaze aligned appropriately.        Right eye: No discharge.        Left eye: No foreign body, discharge or hordeolum.     Extraocular Movements: Extraocular movements intact.     Conjunctiva/sclera:     Left eye: Left conjunctiva is injected. No chemosis or hemorrhage.    Comments: fluorescein stain performed, Corneal abrasions in the 8 through 10 o'clock position, saline rinsed and inspected for foreign bodies  SKIN: warm and dry   UC Treatments / Results  Labs (all labs ordered are listed, but only abnormal results are displayed) Labs Reviewed - No data to display  EKG   Radiology No results found.  Procedures Procedures (including  critical care time)  Medications Ordered in UC Medications  bacitracin-polymyxin b (POLYSPORIN) ophthalmic ointment ( Left Eye Given 06/16/22 1315)    Initial Impression / Assessment and Plan / UC Course  I have reviewed the triage vital signs and the nursing notes.  Pertinent labs & imaging results that were available during my care of the patient were reviewed by me and considered in my medical decision making (see chart for details).     Patient is a 21 y.o. male who presents after acute onset left  eye pain for the past  4 days.  On exam, he has a  corneal abrasion seen on fluorescein stain. Antibiotic ointment applied in office. Sent Polytrim to the pharmacy.  Advised to follow-up with an ophthalmologist or optometrist, if  discomfort/pain is not improving after 7-day course.  Recommended pt pick up eye patch from the pharmacy, if desired. Patient voiced understanding.  Discussed MDM, treatment plan and plan for follow-up with patient/mom who agrees with plan.  Final Clinical Impressions(s) / UC Diagnoses   Final diagnoses:  Abrasion of left cornea, initial encounter     Discharge Instructions      Stop by the pharmacy to pick up your prescriptions.  Follow up with your primary care provider as needed.      ED Prescriptions     Medication Sig Dispense Auth. Provider   trimethoprim-polymyxin b (POLYTRIM) ophthalmic solution Place 2 drops into the left eye every 4 (four) hours. 10 mL Lyndee Hensen, DO      PDMP not reviewed this encounter.   Lyndee Hensen, DO 06/16/22 1923

## 2022-06-16 NOTE — ED Triage Notes (Signed)
Patient c/o left eye pain that started 4 days.  Patient denies any injury. Patient denies any drainage from his eye.

## 2022-12-03 ENCOUNTER — Encounter: Payer: Self-pay | Admitting: Emergency Medicine

## 2022-12-03 ENCOUNTER — Ambulatory Visit
Admission: EM | Admit: 2022-12-03 | Discharge: 2022-12-03 | Disposition: A | Discharge status: Home / Self Care | Payer: Medicaid Other | Attending: Emergency Medicine | Admitting: Emergency Medicine

## 2022-12-03 DIAGNOSIS — J069 Acute upper respiratory infection, unspecified: Secondary | ICD-10-CM | POA: Diagnosis not present

## 2022-12-03 MED ORDER — NYSTATIN 100000 UNIT/ML MT SUSP: 5.0000 mL | Freq: Four times a day (QID) | OROMUCOSAL | 0 refills | Status: DC | PRN

## 2022-12-03 MED ORDER — PREDNISONE 20 MG PO TABS: 40.0000 mg | ORAL_TABLET | Freq: Every day | ORAL | 0 refills | Status: DC

## 2022-12-03 NOTE — ED Triage Notes (Signed)
Pt c/o cough, sore throat, shortness of breath. Started about a week ago. He states he called EMS yesterday and they did a covid test and was negative.

## 2022-12-03 NOTE — ED Provider Notes (Signed)
[facility]    CSN: [id number] Arrival date & time: 12/03/22  1221      History   Chief Complaint Chief Complaint  Patient presents with   Cough   Sore Throat    HPI Marc Green is a 22 y.o. male.   Patient presents for evaluation of nasal congestion, rhinorrhea, sore throat, cough and shortness of breath present for 7 days.  Painful to swallow, difficulty tolerating food but tolerating liquids.  Cough is productive with dark urine sputum with occasional blood, shortness of breath is experienced at rest with intermittent wheezing.  Known exposure to COVID, has COVID test pending at another facility.  Requesting refill of an unknown medication.    History reviewed. No pertinent past medical history.  There are no problems to display for this patient.   Past Surgical History:  Procedure Laterality Date   NO PAST SURGERIES         Home Medications    Prior to Admission medications   Medication Sig Start Date End Date Taking? Authorizing Provider  trimethoprim-polymyxin b (POLYTRIM) ophthalmic solution Place 2 drops into the left eye every 4 (four) hours. 06/16/22   Brimage, Ronnette Juniper, DO  ipratropium (ATROVENT) 0.06 % nasal spray Place 2 sprays into both nostrils 4 (four) times daily as needed for rhinitis. 06/23/18 05/23/19  Coral Spikes, DO  loratadine (CLARITIN) 10 MG tablet Take 1 tablet (10 mg total) by mouth daily. 05/23/19 06/15/19  Karen Kitchens, NP    Family History Family History  Problem Relation Age of Onset   Hypertension Mother    Asthma Mother     Social History Social History   Tobacco Use   Smoking status: Some Days    Types: Cigarettes   Smokeless tobacco: Never  Vaping Use   Vaping Use: Never used  Substance Use Topics   Alcohol use: Never   Drug use: Yes    Types: Marijuana    Comment: 2xday     Allergies   Cephalosporins   Review of Systems Review of Systems  Constitutional: Negative.   HENT:  Positive for  congestion and rhinorrhea. Negative for dental problem, drooling, ear discharge, ear pain, facial swelling, hearing loss, mouth sores, nosebleeds, postnasal drip, sinus pressure, sinus pain, sneezing, sore throat, tinnitus, trouble swallowing and voice change.   Respiratory:  Positive for cough and shortness of breath. Negative for apnea, choking, chest tightness, wheezing and stridor.   Cardiovascular: Negative.   Gastrointestinal: Negative.      Physical Exam Triage Vital Signs ED Triage Vitals  Enc Vitals Group     BP 12/03/22 1349 127/78     Pulse Rate 12/03/22 1349 80     Resp 12/03/22 1349 18     Temp 12/03/22 1349 98.1 F (36.7 C)     Temp Source 12/03/22 1349 Oral     SpO2 12/03/22 1349 97 %     Weight 12/03/22 1347 139 lb 15.9 oz (63.5 kg)     Height 12/03/22 1347 '5\' 6"'$  (1.676 m)     Head Circumference --      Peak Flow --      Pain Score 12/03/22 1346 10     Pain Loc --      Pain Edu? --      Excl. in [place]? --    No data found.  Updated Vital Signs BP 127/78 (BP Location: Right Arm)   Pulse 80   Temp 98.1 F (36.7 C) (Oral)  Resp 18   Ht '5\' 6"'$  (1.676 m)   Wt 139 lb 15.9 oz (63.5 kg)   SpO2 97%   BMI 22.60 kg/m   Visual Acuity Right Eye Distance:   Left Eye Distance:   Bilateral Distance:    Right Eye Near:   Left Eye Near:    Bilateral Near:     Physical Exam Constitutional:      Appearance: Normal appearance. He is well-developed.  HENT:     Head: Normocephalic.     Right Ear: Tympanic membrane, ear canal and external ear normal.     Left Ear: Tympanic membrane, ear canal and external ear normal.     Nose: Nose normal.     Mouth/Throat:     Mouth: Mucous membranes are moist.     Pharynx: Posterior oropharyngeal erythema present.     Tonsils: No tonsillar exudate. 0 on the right. 0 on the left.  Eyes:     Extraocular Movements: Extraocular movements intact.  Cardiovascular:     Rate and Rhythm: Normal rate and regular rhythm.     Pulses:  Normal pulses.     Heart sounds: Normal heart sounds.  Pulmonary:     Effort: Pulmonary effort is normal.     Breath sounds: Normal breath sounds.  Skin:    General: Skin is warm and dry.  Neurological:     Mental Status: He is alert and oriented to person, place, and time. Mental status is at baseline.  Psychiatric:        Mood and Affect: Mood normal.        Behavior: Behavior normal.      UC Treatments / Results  Labs (all labs ordered are listed, but only abnormal results are displayed) Labs Reviewed - No data to display  EKG   Radiology No results found.  Procedures Procedures (including critical care time)  Medications Ordered in UC Medications - No data to display  Initial Impression / Assessment and Plan / UC Course  I have reviewed the triage vital signs and the nursing notes.  Pertinent labs & imaging results that were available during my care of the patient were reviewed by me and considered in my medical decision making (see chart for details).  Viral URI with cough  Patient is in no signs of distress nor toxic appearing.  Vital signs are stable.  Low suspicion for pneumonia, pneumothorax or bronchitis and therefore will defer imaging.  Will defer viral testing due to timeline of illness as well as patient already has COVID test pending.  Prescribed prednisone for management of shortness of breath and wheezing and Magic mouthwash for management of sore throat.May use additional over-the-counter medications as needed for supportive care.  May follow-up with urgent care as needed if symptoms persist or worsen.  Note given.     Final Clinical Impressions(s) / UC Diagnoses   Final diagnoses:  None   Discharge Instructions   None    ED Prescriptions   None    PDMP not reviewed this encounter.   Hans Eden, NP 12/03/22 1441

## 2022-12-03 NOTE — Discharge Instructions (Addendum)
Your symptoms today are most likely being caused by a virus and should steadily improve in time it can take up to 10 days before you truly start to see a turnaround however things will get better  Take prednisone every morning with food for 5 days to reduce inflammation and irritation to your airway which should help calm your cough and shortness of breath  You may gargle and spit Magic mouthwash solution every 4-6 hours to provide a temporary relief to your throat    You can take Tylenol and/or Ibuprofen as needed for fever reduction and pain relief.   For cough: honey 1/2 to 1 teaspoon (you can dilute the honey in water or another fluid).  You can also use guaifenesin and dextromethorphan for cough. You can use a humidifier for chest congestion and cough.  If you don't have a humidifier, you can sit in the bathroom with the hot shower running.      For sore throat: try warm salt water gargles, cepacol lozenges, throat spray, warm tea or water with lemon/honey, popsicles or ice, or OTC cold relief medicine for throat discomfort.   For congestion: take a daily anti-histamine like Zyrtec, Claritin, and a oral decongestant, such as pseudoephedrine.  You can also use Flonase 1-2 sprays in each nostril daily.   It is important to stay hydrated: drink plenty of fluids (water, gatorade/powerade/pedialyte, juices, or teas) to keep your throat moisturized and help further relieve irritation/discomfort.

## 2024-01-21 ENCOUNTER — Emergency Department: Payer: MEDICAID

## 2024-01-21 ENCOUNTER — Other Ambulatory Visit: Payer: Self-pay

## 2024-01-21 ENCOUNTER — Emergency Department
Admission: EM | Admit: 2024-01-21 | Discharge: 2024-01-21 | Disposition: A | Discharge status: Home / Self Care | Payer: MEDICAID | Attending: Emergency Medicine | Admitting: Emergency Medicine

## 2024-01-21 DIAGNOSIS — S51851A Open bite of right forearm, initial encounter: Secondary | ICD-10-CM | POA: Insufficient documentation

## 2024-01-21 DIAGNOSIS — S90812A Abrasion, left foot, initial encounter: Secondary | ICD-10-CM | POA: Insufficient documentation

## 2024-01-21 DIAGNOSIS — Y9302 Activity, running: Secondary | ICD-10-CM | POA: Diagnosis not present

## 2024-01-21 DIAGNOSIS — S90811A Abrasion, right foot, initial encounter: Secondary | ICD-10-CM | POA: Insufficient documentation

## 2024-01-21 DIAGNOSIS — M79601 Pain in right arm: Secondary | ICD-10-CM | POA: Diagnosis present

## 2024-01-21 DIAGNOSIS — Z23 Encounter for immunization: Secondary | ICD-10-CM | POA: Diagnosis not present

## 2024-01-21 DIAGNOSIS — W540XXA Bitten by dog, initial encounter: Secondary | ICD-10-CM | POA: Insufficient documentation

## 2024-01-21 MED ORDER — AMOXICILLIN-POT CLAVULANATE 875-125 MG PO TABS: 1.0000 | ORAL_TABLET | Freq: Two times a day (BID) | ORAL | 0 refills | Status: DC

## 2024-01-21 MED ORDER — BACITRACIN ZINC 500 UNIT/GM EX OINT
TOPICAL_OINTMENT | Freq: Once | CUTANEOUS | Status: AC
  Administered 2024-01-21: 1 via TOPICAL
  Filled 2024-01-21: qty 1.8

## 2024-01-21 MED ORDER — AMOXICILLIN-POT CLAVULANATE 875-125 MG PO TABS
1.0000 | ORAL_TABLET | Freq: Once | ORAL | Status: AC
  Administered 2024-01-21: 1 via ORAL
  Filled 2024-01-21: qty 1

## 2024-01-21 MED ORDER — IBUPROFEN 800 MG PO TABS
800.0000 mg | ORAL_TABLET | Freq: Once | ORAL | Status: AC
  Administered 2024-01-21: 800 mg via ORAL
  Filled 2024-01-21: qty 1

## 2024-01-21 MED ORDER — TETANUS-DIPHTH-ACELL PERTUSSIS 5-2.5-18.5 LF-MCG/0.5 IM SUSY
0.5000 mL | PREFILLED_SYRINGE | Freq: Once | INTRAMUSCULAR | Status: AC
  Administered 2024-01-21: 0.5 mL via INTRAMUSCULAR
  Filled 2024-01-21: qty 0.5

## 2024-01-21 NOTE — Discharge Instructions (Signed)
You may alternate Tylenol 1000 mg every 6 hours as needed for pain, fever and Ibuprofen 800 mg every 6-8 hours as needed for pain, fever.  Please take Ibuprofen with food.  Do not take more than 4000 mg of Tylenol (acetaminophen) in a 24 hour period. ° °

## 2024-01-21 NOTE — ED Provider Notes (Signed)
 [facility] Provider Note    Event Date/Time   First MD Initiated Contact with Patient 01/21/24 [id number]     (approximate)   History   Animal Bite (Patient C/O dog bite on right upper extremity after running from the Police. The dog is known and vaccinated. )   HPI  Marc Green is a 23 y.o. male right-hand-dominant with no significant past medical history who presents to the emergency department in police custody after he ran from police at a traffic stop and was bit in the right forearm by their dog.  Police report that he was running barefoot through briars.  He is complaining of right arm pain, bilateral foot pain and being sore all over.  Did not hit his head or lose consciousness.  Unsure of his last tetanus vaccine.  Police dog is up-to-date on rabies vaccination.  History provided by patient, place.    History reviewed. No pertinent past medical history.  Past Surgical History:  Procedure Laterality Date   NO PAST SURGERIES      MEDICATIONS:  Prior to Admission medications   Medication Sig Start Date End Date Taking? Authorizing Provider  magic mouthwash (nystatin , lidocaine, diphenhydrAMINE , alum & mag hydroxide) suspension Swish and spit 5 mLs 4 (four) times daily as needed for mouth pain. 12/03/22   White, Maybelle Spatz, NP  predniSONE  (DELTASONE ) 20 MG tablet Take 2 tablets (40 mg total) by mouth daily. 12/03/22   Reena Canning, NP  trimethoprim -polymyxin b  (POLYTRIM ) ophthalmic solution Place 2 drops into the left eye every 4 (four) hours. 06/16/22   Brimage, Vondra, DO  ipratropium (ATROVENT ) 0.06 % nasal spray Place 2 sprays into both nostrils 4 (four) times daily as needed for rhinitis. 06/23/18 05/23/19  Cook, Jayce G, DO  loratadine  (CLARITIN ) 10 MG tablet Take 1 tablet (10 mg total) by mouth daily. 05/23/19 06/15/19  Shirline Dover, NP    Physical Exam   Triage Vital Signs: ED Triage Vitals  Encounter Vitals Group     BP 01/21/24 0359  129/76     Systolic BP Percentile --      Diastolic BP Percentile --      Pulse Rate 01/21/24 0359 67     Resp 01/21/24 0359 18     Temp 01/21/24 0359 98.4 F (36.9 C)     Temp Source 01/21/24 0359 Oral     SpO2 01/21/24 0359 100 %     Weight 01/21/24 0353 140 lb (63.5 kg)     Height 01/21/24 0353 5\' 5"  (1.651 m)     Head Circumference --      Peak Flow --      Pain Score 01/21/24 0438 5     Pain Loc --      Pain Education --      Exclude from Growth Chart --     Most recent vital signs: Vitals:   01/21/24 0359  BP: 129/76  Pulse: 67  Resp: 18  Temp: 98.4 F (36.9 C)  SpO2: 100%     CONSTITUTIONAL: Alert, responds appropriately to questions. Well-appearing; well-nourished; GCS 15 HEAD: Normocephalic; atraumatic EYES: Conjunctivae clear, PERRL, EOMI ENT: normal nose; no rhinorrhea; moist mucous membranes NECK: Supple, no midline spinal tenderness, step-off or deformity; trachea midline CARD: RRR; S1 and S2 appreciated; no murmurs, no clicks, no rubs, no gallops RESP: Normal chest excursion without splinting or tachypnea; breath sounds clear and equal bilaterally; no wheezes, no rhonchi, no rales; no hypoxia or respiratory  distress CHEST:  chest wall stable, no crepitus or ecchymosis or deformity, nontender to palpation; no flail chest ABD/GI: Non-distended; soft, non-tender, no rebound, no guarding; no ecchymosis or other lesions noted PELVIS:  stable, nontender to palpation BACK:  The back appears normal; no midline spinal tenderness, step-off or deformity EXT: Tenderness over bilateral feet with superficial abrasions to the dorsal feet.  2+ DP pulses bilaterally.  No calf tenderness or calf swelling.  He is also tender to palpation diffusely throughout the right upper extremity.  He does have multiple small puncture wounds to the right forearm.  No sign of retained foreign body.  Does have mild soft tissue swelling and bruising.  2+ right radial pulse and normal capillary  refill.  No joint effusion. SKIN: Normal color for age and race; warm NEURO: No facial asymmetry, normal speech, moving all extremities equally, ambulates with normal gait  ED Results / Procedures / Treatments   LABS: (all labs ordered are listed, but only abnormal results are displayed) Labs Reviewed - No data to display   EKG:  EKG Interpretation Date/Time:    Ventricular Rate:    PR Interval:    QRS Duration:    QT Interval:    QTC Calculation:   R Axis:      Text Interpretation:            RADIOLOGY: My personal review and interpretation of imaging: X-rays show no acute abnormality.  I have personally reviewed all radiology reports. DG Forearm Right Result Date: 01/21/2024 CLINICAL DATA:  Dog bite.  Pain. EXAM: RIGHT FOREARM - 2 VIEW COMPARISON:  None Available. FINDINGS: No evidence for an acute fracture. Soft tissue gas identified in the proximal forearm consistent with the history of dog bite. No evidence for retained radiopaque soft tissue foreign body. 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 IMPRESSION: 1. Soft tissue gas in the proximal forearm consistent with the history of dog bite. No evidence for retained radiopaque soft tissue foreign body. 2. No acute bony findings. Electronically Signed   By: Donnal Fusi M.D.   On: 01/21/2024 06:27   DG Humerus Right Result Date: 01/21/2024 CLINICAL DATA:  Right upper extremity pain after a dog bite. EXAM: RIGHT HUMERUS - 2+ VIEW COMPARISON:  None Available. FINDINGS: There is no evidence of fracture or other focal bone lesions. Soft tissues are unremarkable. No retained radiopaque foreign body. IMPRESSION: Negative. Electronically Signed   By: Donnal Fusi M.D.   On: 01/21/2024 06:26     PROCEDURES:  Critical Care performed: No    Procedures    IMPRESSION / MDM / ASSESSMENT AND PLAN / ED COURSE  I reviewed the triage vital signs and the nursing notes.  Patient here after dog bite to  the right upper extremity and foot pain after running from the police.     DIFFERENTIAL DIAGNOSIS (includes but not limited to):   Puncture wounds, contusions, abrasions, fracture, no sign of superimposed infection, no sign of compartment syndrome,  Patient's presentation is most consistent with acute complicated illness / injury requiring diagnostic workup.  PLAN: Will obtain x-rays of the right upper extremity, bilateral feet.  Will give ibuprofen  for pain.  Will update his tetanus vaccine, clean his wounds and give Augmentin.  There are no lacerations that need repair at this time.   MEDICATIONS GIVEN IN ED: Medications  ibuprofen  (ADVIL ) tablet 800 mg (800 mg Oral Given 01/21/24 0613)  bacitracin  ointment (1 Application Topical Given 01/21/24 0613)  Tdap (BOOSTRIX)  injection 0.5 mL (0.5 mLs Intramuscular Given 01/21/24 0613)  amoxicillin-clavulanate (AUGMENTIN) 875-125 MG per tablet 1 tablet (1 tablet Oral Given 01/21/24 0612)     ED COURSE: X-rays reviewed and interpreted by myself and the radiologist and show no acute abnormality.  Will discharge in police custody.  Will discharge with antibiotics.  Recommended alternating Tylenol, Motrin .  Discussed wound care instructions.   At this time, I do not feel there is any life-threatening condition present. I reviewed all nursing notes, vitals, pertinent previous records.  All lab and urine results, EKGs, imaging ordered have been independently reviewed and interpreted by myself.  I reviewed all available radiology reports from any imaging ordered this visit.  Based on my assessment, I feel the patient is safe to be discharged home without further emergent workup and can continue workup as an outpatient as needed. Discussed all findings, treatment plan as well as usual and customary return precautions.  They verbalize understanding and are comfortable with this plan.  Outpatient follow-up has been provided as needed.  All questions have been  answered.    CONSULTS:  none   OUTSIDE RECORDS REVIEWED: Reviewed family medicine note on 11/04/2023.       FINAL CLINICAL IMPRESSION(S) / ED DIAGNOSES   Final diagnoses:  Dog bite, initial encounter     Rx / DC Orders   ED Discharge Orders          Ordered    amoxicillin-clavulanate (AUGMENTIN) 875-125 MG tablet  2 times daily        01/21/24 1610             Note:  This document was prepared using Dragon voice recognition software and may include unintentional dictation errors.   Dhruva Orndoff, Clover Dao, DO 01/21/24 [id number]

## 2024-01-21 NOTE — ED Triage Notes (Signed)
 Animal Bite (Patient C/O dog bite on right upper extremity after running from the Police. The dog is known and vaccinated. )  Patient was cleared by EMS, but must be cleared by a Provider to be able [facility].

## 2024-01-21 NOTE — ED Notes (Signed)
 Wounds cleaned, bacitracin  applied to puncture wounds, arm wrapped with guaze and kerlix. Tetanus shot given IM left deltoid.

## 2024-06-02 ENCOUNTER — Ambulatory Visit
Admission: EM | Admit: 2024-06-02 | Discharge: 2024-06-02 | Disposition: A | Discharge status: Home / Self Care | Payer: MEDICAID | Attending: Family Medicine | Admitting: Family Medicine

## 2024-06-02 ENCOUNTER — Encounter: Payer: Self-pay | Admitting: Emergency Medicine

## 2024-06-02 DIAGNOSIS — R519 Headache, unspecified: Secondary | ICD-10-CM

## 2024-06-02 NOTE — Discharge Instructions (Addendum)
 You can take Tylenol and/or Ibuprofen  as needed for fever reduction and pain relief.     It is important to stay hydrated: drink plenty of fluids (water, gatorade/powerade/pedialyte, juices, or teas) to keep your throat moisturized and help further relieve irritation/discomfort.    Return or go to the Emergency Department if symptoms worsen or do not improve in the next few days

## 2024-06-02 NOTE — ED Triage Notes (Signed)
 Pt presents with bodyaches and headache x 3 days. Pt states he has been working hard at work and gym thinks that has to do with his symptoms. He has not taken anything for his symptoms.

## 2024-06-02 NOTE — ED Provider Notes (Signed)
 [facility]    CSN: [id number] Arrival date & time: 06/02/24  1314      History   Chief Complaint Chief Complaint  Patient presents with   Generalized Body Aches   Headache    HPI Marc Green is a 23 y.o. male.   HPI  History obtained from the patient. Marc Green presents for intermittent headache, body aches that started 3 days ago.  No headache now. Denies rhinorrhea, nasal congestion, vomiting, diarrhea, fever, chest pain, shortness of breath or abdominal pain. Not taken anything for his symptoms.       History reviewed. No pertinent past medical history.  There are no active problems to display for this patient.   Past Surgical History:  Procedure Laterality Date   NO PAST SURGERIES         Home Medications    Prior to Admission medications   Medication Sig Start Date End Date Taking? Authorizing Provider  ipratropium (ATROVENT ) 0.06 % nasal spray Place 2 sprays into both nostrils 4 (four) times daily as needed for rhinitis. 06/23/18 05/23/19  Cook, Jayce G, DO  loratadine  (CLARITIN ) 10 MG tablet Take 1 tablet (10 mg total) by mouth daily. 05/23/19 06/15/19  Elnor Dorise BRAVO, NP    Family History Family History  Problem Relation Age of Onset   Hypertension Mother    Asthma Mother     Social History Social History   Tobacco Use   Smoking status: Never   Smokeless tobacco: Never  Vaping Use   Vaping status: Every Day  Substance Use Topics   Alcohol use: Yes   Drug use: Yes    Types: Marijuana    Comment: 2xday     Allergies   Cephalosporins and Grapefruit extract   Review of Systems Review of Systems: negative unless otherwise stated in HPI.      Physical Exam Triage Vital Signs ED Triage Vitals  Encounter Vitals Group     BP --      Girls Systolic BP Percentile --      Girls Diastolic BP Percentile --      Boys Systolic BP Percentile --      Boys Diastolic BP Percentile --      Pulse --      Resp --      Temp --       Temp src --      SpO2 --      Weight 06/02/24 1434 140 lb (63.5 kg)     Height --      Head Circumference --      Peak Flow --      Pain Score 06/02/24 1433 7     Pain Loc --      Pain Education --      Exclude from Growth Chart --    No data found.  Updated Vital Signs BP 110/76 (BP Location: Left Arm)   Pulse 65   Temp 98.2 F (36.8 C) (Oral)   Resp 16   Wt 63.5 kg   SpO2 100%   BMI 23.30 kg/m   Visual Acuity Right Eye Distance:   Left Eye Distance:   Bilateral Distance:    Right Eye Near:   Left Eye Near:    Bilateral Near:     Physical Exam GEN: well appearing male in no acute distress  NECK: normal ROM, no meningismus  CVS: well perfused, regular rate and rhythm RESP: speaking in full sentences without pause, no respiratory distress, clear  bilaterally MSK:  no midline C, T or L spine tenderness, normal ROM   NEURO:  alert, oriented, speech normal, CN 2-12 grossly intact, no facial droop,  sensation grossly intact, strength 5/5 bilateral UE and LE, normal coordination     UC Treatments / Results  Labs (all labs ordered are listed, but only abnormal results are displayed) Labs Reviewed - No data to display  EKG   Radiology No results found.   Procedures Procedures (including critical care time)  Medications Ordered in UC Medications - No data to display  Initial Impression / Assessment and Plan / UC Course  I have reviewed the triage vital signs and the nursing notes.  Pertinent labs & imaging results that were available during my care of the patient were reviewed by me and considered in my medical decision making (see chart for details).       Pt is a 23 y.o. male who presents for 3 days of body aches and headache.  Marc Green is afebrile here without recent antipyretics. Satting well on room air. Overall pt is well appearing, well hydrated, without respiratory distress. Neurological exam is unremarkable.  COVID and influenza panel  declined.  Pt requesting work note which was provided. Typical duration of symptoms discussed.   Return and ED precautions given and voiced understanding. Discussed MDM, treatment plan and plan for follow-up with patient who agrees with plan.     Final Clinical Impressions(s) / UC Diagnoses   Final diagnoses:  Bad headache     Discharge Instructions      You can take Tylenol and/or Ibuprofen  as needed for fever reduction and pain relief.     It is important to stay hydrated: drink plenty of fluids (water, gatorade/powerade/pedialyte, juices, or teas) to keep your throat moisturized and help further relieve irritation/discomfort.    Return or go to the Emergency Department if symptoms worsen or do not improve in the next few days      ED Prescriptions   None    PDMP not reviewed this encounter.   Mekala Winger, DO 06/02/24 1448

## 2024-07-03 ENCOUNTER — Encounter: Payer: Self-pay | Admitting: Emergency Medicine

## 2024-07-03 ENCOUNTER — Ambulatory Visit
Admission: EM | Admit: 2024-07-03 | Discharge: 2024-07-03 | Disposition: A | Discharge status: Home / Self Care | Payer: MEDICAID | Attending: Emergency Medicine | Admitting: Emergency Medicine

## 2024-07-03 DIAGNOSIS — J029 Acute pharyngitis, unspecified: Secondary | ICD-10-CM | POA: Insufficient documentation

## 2024-07-03 LAB — GROUP A STREP BY PCR: Group A Strep by PCR: NOT DETECTED

## 2024-07-03 MED ORDER — AZITHROMYCIN 250 MG PO TABS: 250.0000 mg | ORAL_TABLET | Freq: Every day | ORAL | 0 refills | Status: DC

## 2024-07-03 NOTE — ED Provider Notes (Signed)
 [facility]    CSN: [id number] Arrival date & time: 07/03/24  1131      History   Chief Complaint Chief Complaint  Patient presents with   Sore Throat    HPI Marc Green is a 23 y.o. male.   HPI  23 year old male with past medical history significant for ADHD, allergic rhinitis, depressive disorder, and intellectual disability presents for evaluation of 4 days worth of sore throat.  He also receives tube lumps in his throat that prevent him from swallowing or eating.  He reports that he drank liquor on an empty stomach and started vomiting prior to the bumps developing.  He denies any fever or upper or lower respiratory symptoms.  History reviewed. No pertinent past medical history.  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 Authorizing Provider  azithromycin (ZITHROMAX Z-PAK) 250 MG tablet Take 1 tablet (250 mg total) by mouth daily. Take 2 tablets on the first day and then 1 tablet daily thereafter for a total of 5 days of treatment. 07/03/24  Yes Bernardino Ditch, NP  methocarbamol (ROBAXIN) 500 MG tablet Take 500 mg by mouth. 08/19/22  Yes [provider]  ipratropium (ATROVENT ) 0.06 % nasal spray Place 2 sprays into both nostrils 4 (four) times daily as needed for rhinitis. 06/23/18 05/23/19  Cook, Jayce G, DO  loratadine   (CLARITIN ) 10 MG tablet Take 1 tablet (10 mg total) by mouth daily. 05/23/19 06/15/19  Elnor Dorise BRAVO, NP    Family History Family History  Problem Relation Age of Onset   Hypertension Mother    Asthma Mother     Social History Social History   Tobacco Use   Smoking status: Never    Passive exposure: Current   Smokeless tobacco: Never  Vaping Use   Vaping status: Every Day   Substances: Nicotine, Flavoring  Substance Use Topics   Alcohol use: Yes   Drug use: Yes    Types: Marijuana    Comment: 2xday     Allergies   Cephalosporins and Grapefruit extract   Review of Systems Review of Systems  Constitutional:  Negative for fever.  HENT:  Positive for sore throat. Negative for congestion, ear pain and rhinorrhea.   Respiratory:  Negative for cough, shortness of breath and wheezing.      Physical Exam Triage Vital Signs ED Triage Vitals  Encounter Vitals Group     BP      Girls Systolic BP Percentile      Girls Diastolic BP Percentile      Boys Systolic BP Percentile      Boys Diastolic BP Percentile      Pulse      Resp      Temp      Temp src      SpO2      Weight      Height  Head Circumference      Peak Flow      Pain Score      Pain Loc      Pain Education      Exclude from Growth Chart    No data found.  Updated Vital Signs BP (!) 136/93 (BP Location: Left Arm)   Pulse 62   Temp 98.8 F (37.1 C) (Oral)   Resp 16   Wt 139 lb 15.9 oz (63.5 kg)   SpO2 100%   BMI 23.30 kg/m   Visual Acuity Right Eye Distance:   Left Eye Distance:   Bilateral Distance:    Right Eye Near:   Left Eye Near:    Bilateral Near:     Physical Exam Vitals and nursing note reviewed.  Constitutional:      Appearance: Normal appearance. He is not ill-appearing.  HENT:     Head: Normocephalic and atraumatic.     Mouth/Throat:     Mouth: Mucous membranes are moist.     Pharynx: Oropharynx is clear. Posterior oropharyngeal erythema present. No oropharyngeal  exudate.     Comments: Bilateral tonsillar pillars are erythematous and the right tonsillar pillar is 1+ edematous.  No exudate noted. Musculoskeletal:     Cervical back: Normal range of motion and neck supple. No tenderness.  Lymphadenopathy:     Cervical: No cervical adenopathy.  Skin:    General: Skin is warm and dry.     Capillary Refill: Capillary refill takes less than 2 seconds.     Findings: No rash.  Neurological:     General: No focal deficit present.     Mental Status: He is alert and oriented to person, place, and time.      UC Treatments / Results  Labs (all labs ordered are listed, but only abnormal results are displayed) Labs Reviewed  GROUP A STREP BY PCR    EKG   Radiology No results found.  Procedures Procedures (including critical care time)  Medications Ordered in UC Medications - No data to display  Initial Impression / Assessment and Plan / UC Course  I have reviewed the triage vital signs and the nursing notes.  Pertinent labs & imaging results that were available during my care of the patient were reviewed by me and considered in my medical decision making (see chart for details).   Patient is a nontoxic-appearing 23 year old male presenting for evaluation of 4 days worth of a sore throat without other associated upper or lower respiratory symptoms.  He is concerned he may have herpes.  There is another person with him who states that that is what she thinks also because she felt similar, minus a sore throat, when she was diagnosed with herpes.  He is reporting that he has to lumps in the back of his throat that are painful when he swallows or eats.  The 2 lumps he is referring to are his tonsillar pillars.  There are no herpetic lesions noted on the hard palate, soft palate, posterior oropharynx, or circumorally.  He has no cervical lymphadenopathy.  He reports that the painful lumps began after he had an episode of vomiting following drinking liquor  on an empty stomach.  It is possible that he has irritated his oropharyngeal mucosa with gastric secretions.  Other differential diagnose includes strep pharyngitis and viral pharyngitis.  I will order a strep PCR.  Strep PCR is negative.  Due to the fact that the patient began having a sore throat after vomiting  I will treat him with azithromycin daily with food for 5 days to cover for potential GI pathogens which could be contributing to his symptoms.  He may also use over-the-counter Tylenol and/or ibuprofen , over-the-counter Chloraseptic or Sucrets lozenges, and gargle with salt water to help soothe his throat.  If his symptoms do not improve he should return for reevaluation or see his primary care provider.   Final Clinical Impressions(s) / UC Diagnoses   Final diagnoses:  Pharyngitis, unspecified etiology     Discharge Instructions      Take the azithromycin once daily for 5 days for treatment of your sore throat.  Gargle with warm salt water 2-3 times a day to soothe your throat, aid in pain relief, and aid in healing.  Take over-the-counter ibuprofen  according to the package instructions as needed for pain.  You can also use Chloraseptic or Sucrets lozenges, 1 lozenge every 2 hours as needed for throat pain.  If you develop any new or worsening symptoms return for reevaluation.      ED Prescriptions     Medication Sig Dispense Auth. Provider   azithromycin (ZITHROMAX Z-PAK) 250 MG tablet Take 1 tablet (250 mg total) by mouth daily. Take 2 tablets on the first day and then 1 tablet daily thereafter for a total of 5 days of treatment. 6 tablet Bernardino Ditch, NP      PDMP not reviewed this encounter.   Bernardino Ditch, NP 07/03/24 1226

## 2024-07-03 NOTE — ED Triage Notes (Signed)
 Pt presents c/o sore throat  x 4 days. Pt reports he has two lumps in his throat that prevents him form swallowing or eating. Pt reports he drank liquor on an empty stomach and started to vomit and then he noticed the bumps.

## 2024-07-03 NOTE — Discharge Instructions (Addendum)
 Take the azithromycin once daily for 5 days for treatment of your sore throat.  Gargle with warm salt water 2-3 times a day to soothe your throat, aid in pain relief, and aid in healing.  Take over-the-counter ibuprofen  according to the package instructions as needed for pain.  You can also use Chloraseptic or Sucrets lozenges, 1 lozenge every 2 hours as needed for throat pain.  If you develop any new or worsening symptoms return for reevaluation.

## 2024-09-17 ENCOUNTER — Encounter: Payer: Self-pay | Admitting: Emergency Medicine

## 2024-09-17 ENCOUNTER — Other Ambulatory Visit: Payer: Self-pay

## 2024-09-17 ENCOUNTER — Ambulatory Visit
Admission: EM | Admit: 2024-09-17 | Discharge: 2024-09-17 | Disposition: A | Discharge status: Home / Self Care | Payer: MEDICAID | Attending: Emergency Medicine | Admitting: Emergency Medicine

## 2024-09-17 ENCOUNTER — Ambulatory Visit: Payer: Self-pay

## 2024-09-17 DIAGNOSIS — R079 Chest pain, unspecified: Secondary | ICD-10-CM | POA: Diagnosis not present

## 2024-09-17 NOTE — ED Notes (Signed)
 Patient is being discharged from the Urgent Care and sent to the Emergency Department via Personal Vehicle . Per Harriette, NP, patient is in need of higher level of care due to Abnormal EKG. Patient is aware and verbalizes understanding of plan of care.  Vitals:   09/17/24 2004  BP: 124/71  Pulse: 66  Resp: 16  Temp: 98.4 F (36.9 C)  SpO2: 100%

## 2024-09-17 NOTE — ED Provider Notes (Signed)
 " [facility]    CSN: [id number] Arrival date & time: 09/17/24  1738      History   Chief Complaint No chief complaint on file.   HPI Marc Green is a 23 y.o. male.   HPI  23 year old male with past medical history difficult for intellectual disability, reading disorder, and ADHD presents for evaluation of nonradiating midsternal chest pain that started 3 days ago.  This is associated with sweating and nausea but no cough or shortness of breath.  Provoking symptoms include movement and physical activity.  No alleviating symptoms.  History reviewed. No pertinent past medical history.  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 Authorizing Provider  ipratropium (ATROVENT ) 0.06 % nasal spray Place 2 sprays into both nostrils 4 (four) times daily as needed for rhinitis. 06/23/18 05/23/19  Cook, Jayce G, DO  loratadine  (CLARITIN ) 10 MG tablet Take 1 tablet (10 mg total) by mouth daily. 05/23/19 06/15/19  Elnor Dorise BRAVO, NP    Family History Family History  Problem Relation Age of Onset   Hypertension Mother    Asthma Mother     Social History Social History[1]   Allergies   Cephalosporins and Grapefruit extract   Review of Systems Review of Systems  Constitutional:  Positive for diaphoresis. Negative for fever.  Respiratory:   Negative for cough and shortness of breath.   Cardiovascular:  Positive for chest pain.  Gastrointestinal:  Positive for nausea.     Physical Exam Triage Vital Signs ED Triage Vitals  Encounter Vitals Group     BP      Girls Systolic BP Percentile      Girls Diastolic BP Percentile      Boys Systolic BP Percentile      Boys Diastolic BP Percentile      Pulse      Resp      Temp      Temp src      SpO2      Weight      Height      Head Circumference      Peak Flow      Pain Score      Pain Loc      Pain Education      Exclude from Growth Chart    No data found.  Updated Vital Signs BP 124/71   Pulse 66   Temp 98.4 F (36.9 C) (Oral)   Resp 16   SpO2 100%   Visual Acuity Right Eye Distance:   Left Eye Distance:   Bilateral Distance:    Right Eye Near:   Left Eye Near:    Bilateral Near:     Physical Exam Vitals and nursing note reviewed.  Constitutional:      Appearance: Normal appearance. He is not ill-appearing.  HENT:     Head: Normocephalic and atraumatic.  Cardiovascular:     Rate and Rhythm: Normal rate and regular rhythm.     Pulses: Normal pulses.     Heart sounds: Normal heart sounds. No murmur heard.    No friction rub. No gallop.  Pulmonary:     Effort: Pulmonary effort is normal.     Breath sounds: Normal breath sounds. No wheezing, rhonchi or rales.  Chest:     Chest wall: No tenderness.  Skin:    General: Skin is warm and dry.     Capillary Refill: Capillary refill takes less than 2 seconds.     Findings: No rash.  Neurological:     General: No focal deficit present.     Mental Status: He is alert and oriented to person, place, and time.      UC Treatments / Results  Labs (all labs ordered are listed, but only abnormal results are displayed) Labs Reviewed - No data to display  EKG Normal sinus rhythm with a ventricular rate of 62 bpm PR interval 164 ms QRS duration 108 ms QT/QTc 382/387 ms Incomplete right bundle branch  block.  Radiology No results found.  Procedures Procedures (including critical care time)  Medications Ordered in UC Medications - No data to display  Initial Impression / Assessment and Plan / UC Course  I have reviewed the triage vital signs and the nursing notes.  Pertinent labs & imaging results that were available during my care of the patient were reviewed by me and considered in my medical decision making (see chart for details).   Patient is a nontoxic-appearing 23 year old male presenting for evaluation of 3 days worth of substernal chest pain.  The pain has been constant and does not radiate.  It is not reproducible to palpation.  It is associated with sweating and nausea but he denies shortness of breath.  His lungs are clear auscultation all fields and his heart sounds are S1-S2 with regular rate and rhythm.  His EKG shows incomplete right bundle branch block.  I have no other tracings available for comparison in epic.  He also has a questionable Q wave in V3 and V4.  Due to his abnormal EKG and constant chest pain I do feel that he needs to be evaluated in the ER and have blood work to rule out a cardiac event.  He has elected to go to [facility] via [facility]OV.   Final Clinical Impressions(s) / UC Diagnoses   Final diagnoses:  Chest pain, unspecified type     Discharge Instructions      Please go to the emergency department at [facility] to be evaluated for your chest pain.  Please go to.     ED Prescriptions   None    PDMP not reviewed this encounter.    [1]  Social History Tobacco Use   Smoking status: Never    Passive exposure: Current   Smokeless tobacco: Never  Vaping Use   Vaping status: Every Day   Substances: Nicotine, Flavoring  Substance Use Topics   Alcohol use: Yes    Comment: occ   Drug use: Yes    Types: Marijuana    Comment: 2xday     Bernardino Ditch, NP 09/17/24 2027  "

## 2024-09-17 NOTE — Discharge Instructions (Signed)
 Please go to the emergency department at [facility] to be evaluated for your chest pain.  Please go to.

## 2024-09-17 NOTE — ED Triage Notes (Signed)
 Pt c/o non radiating midsternal chest painx3d. Pt denies N/V, SOB
